# Patient Record
Sex: Female | Born: 1975 | Race: White | Hispanic: No | Marital: Married
Health system: Southern US, Community
[De-identification: ages and names within clinical notes are randomized; demographics above are authoritative.]

## PROBLEM LIST (undated history)

## (undated) HISTORY — PX: AUGMENTATION MAMMAPLASTY: SUR837

---

## 2016-04-16 DIAGNOSIS — J018 Other acute sinusitis: Secondary | ICD-10-CM | POA: Diagnosis not present

## 2016-04-26 DIAGNOSIS — G43A0 Cyclical vomiting, not intractable: Secondary | ICD-10-CM | POA: Diagnosis not present

## 2016-04-26 DIAGNOSIS — R197 Diarrhea, unspecified: Secondary | ICD-10-CM | POA: Diagnosis not present

## 2017-02-18 ENCOUNTER — Other Ambulatory Visit: Payer: Self-pay | Admitting: Family Medicine

## 2017-02-18 DIAGNOSIS — Z1231 Encounter for screening mammogram for malignant neoplasm of breast: Secondary | ICD-10-CM

## 2017-02-18 DIAGNOSIS — Z01419 Encounter for gynecological examination (general) (routine) without abnormal findings: Secondary | ICD-10-CM | POA: Diagnosis not present

## 2017-02-18 DIAGNOSIS — Z6820 Body mass index (BMI) 20.0-20.9, adult: Secondary | ICD-10-CM | POA: Diagnosis not present

## 2017-02-18 DIAGNOSIS — Z23 Encounter for immunization: Secondary | ICD-10-CM | POA: Diagnosis not present

## 2017-02-18 DIAGNOSIS — Z0000 Encounter for general adult medical examination without abnormal findings: Secondary | ICD-10-CM | POA: Diagnosis not present

## 2017-02-18 DIAGNOSIS — Z124 Encounter for screening for malignant neoplasm of cervix: Secondary | ICD-10-CM | POA: Diagnosis not present

## 2017-02-18 DIAGNOSIS — Z136 Encounter for screening for cardiovascular disorders: Secondary | ICD-10-CM | POA: Diagnosis not present

## 2017-03-06 ENCOUNTER — Ambulatory Visit: Payer: Self-pay

## 2017-04-22 DIAGNOSIS — Z23 Encounter for immunization: Secondary | ICD-10-CM | POA: Diagnosis not present

## 2017-12-03 DIAGNOSIS — J020 Streptococcal pharyngitis: Secondary | ICD-10-CM | POA: Diagnosis not present

## 2018-02-05 DIAGNOSIS — L237 Allergic contact dermatitis due to plants, except food: Secondary | ICD-10-CM | POA: Diagnosis not present

## 2018-02-05 DIAGNOSIS — L247 Irritant contact dermatitis due to plants, except food: Secondary | ICD-10-CM | POA: Diagnosis not present

## 2018-02-23 DIAGNOSIS — Z114 Encounter for screening for human immunodeficiency virus [HIV]: Secondary | ICD-10-CM | POA: Diagnosis not present

## 2018-02-23 DIAGNOSIS — Z0000 Encounter for general adult medical examination without abnormal findings: Secondary | ICD-10-CM | POA: Diagnosis not present

## 2018-02-23 DIAGNOSIS — Z13220 Encounter for screening for lipoid disorders: Secondary | ICD-10-CM | POA: Diagnosis not present

## 2018-02-23 DIAGNOSIS — Z1329 Encounter for screening for other suspected endocrine disorder: Secondary | ICD-10-CM | POA: Diagnosis not present

## 2018-02-25 ENCOUNTER — Other Ambulatory Visit: Payer: Self-pay | Admitting: Family Medicine

## 2018-02-25 DIAGNOSIS — Z6821 Body mass index (BMI) 21.0-21.9, adult: Secondary | ICD-10-CM | POA: Diagnosis not present

## 2018-02-25 DIAGNOSIS — Z1231 Encounter for screening mammogram for malignant neoplasm of breast: Secondary | ICD-10-CM

## 2018-02-25 DIAGNOSIS — Z23 Encounter for immunization: Secondary | ICD-10-CM | POA: Diagnosis not present

## 2018-02-25 DIAGNOSIS — Z0000 Encounter for general adult medical examination without abnormal findings: Secondary | ICD-10-CM | POA: Diagnosis not present

## 2018-03-03 ENCOUNTER — Ambulatory Visit: Payer: Self-pay

## 2018-03-20 ENCOUNTER — Ambulatory Visit: Payer: Self-pay

## 2018-04-01 ENCOUNTER — Ambulatory Visit
Admission: RE | Admit: 2018-04-01 | Discharge: 2018-04-01 | Disposition: A | Discharge status: Home / Self Care | Payer: BLUE CROSS/BLUE SHIELD | Source: Ambulatory Visit | Attending: Family Medicine | Admitting: Family Medicine

## 2018-04-01 ENCOUNTER — Other Ambulatory Visit: Payer: Self-pay | Admitting: Family Medicine

## 2018-04-01 DIAGNOSIS — Z1231 Encounter for screening mammogram for malignant neoplasm of breast: Secondary | ICD-10-CM

## 2018-11-29 IMAGING — MG DIGITAL SCREENING BILATERAL MAMMOGRAM WITH IMPLANTS, CAD AND TOM
9 of 12 series · 9 of 28 positions shown · non-contrast
Comparison: None.

CLINICAL DATA: Screening. Baseline.

EXAM:
DIGITAL SCREENING BILATERAL MAMMOGRAM WITH IMPLANTS, CAD AND TOMO
The patient has retropectoral implants. Standard and implant
displaced views were performed.

[R CC]
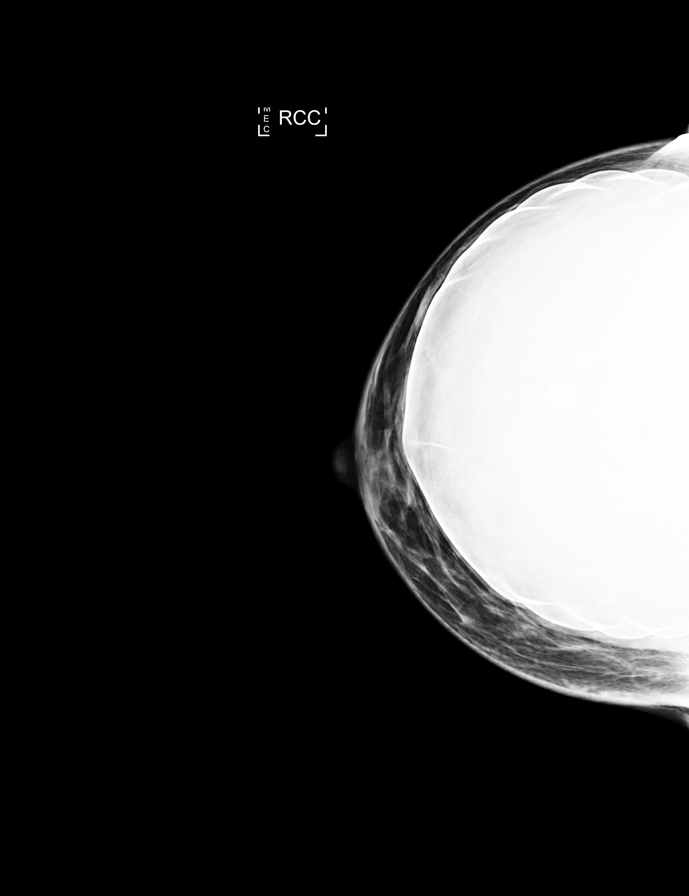

[R MLO]
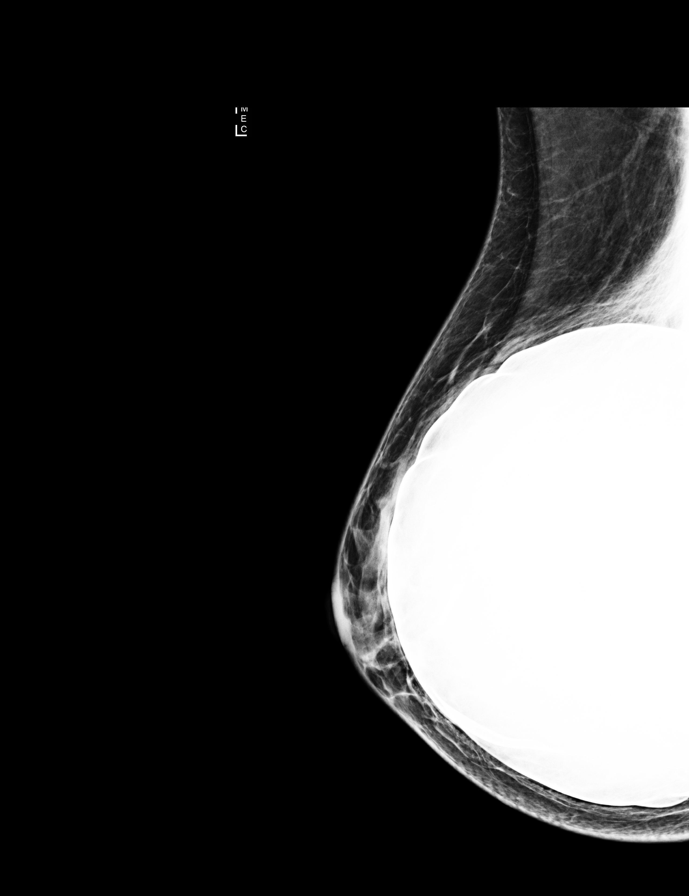

[L CC]
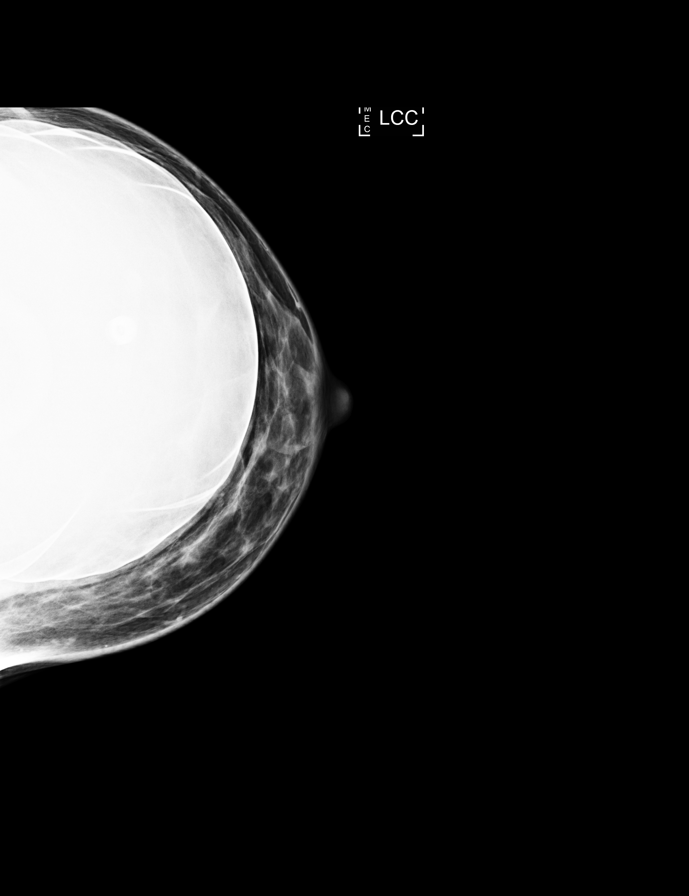

[L MLO]
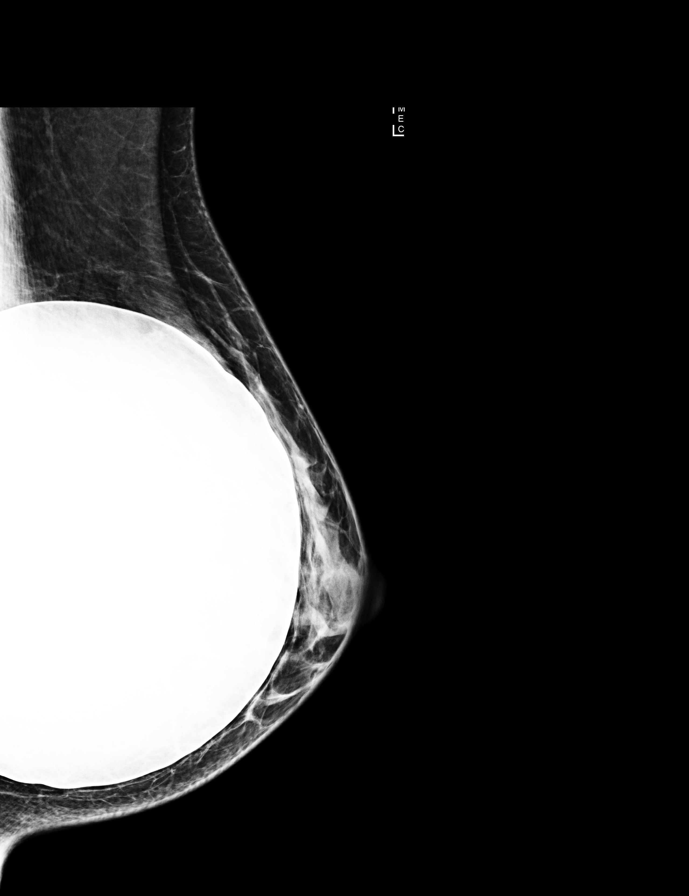

[R MLO synth-2D]
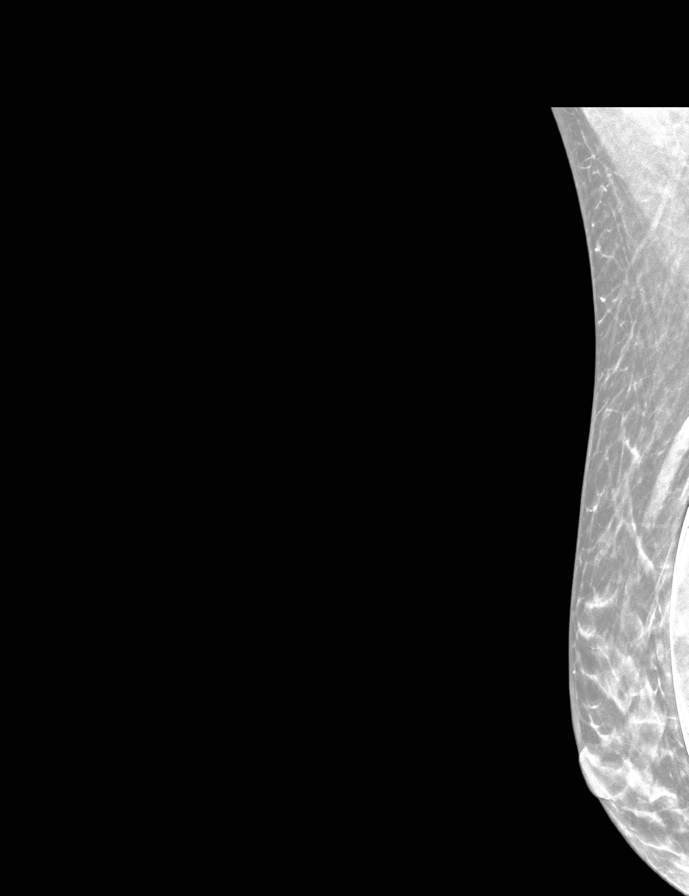

[L CC synth-2D]
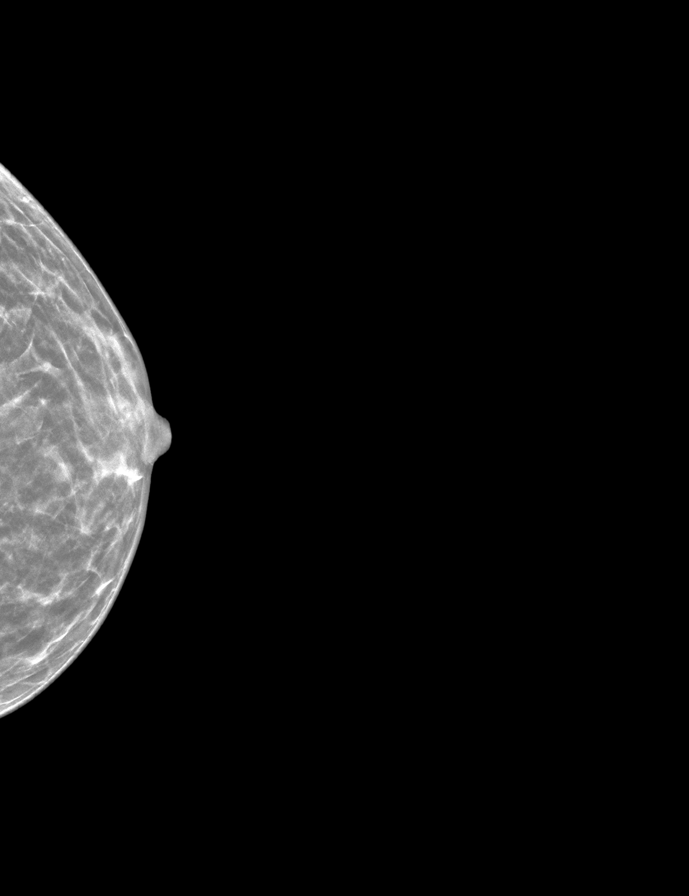

[R CC synth-2D]
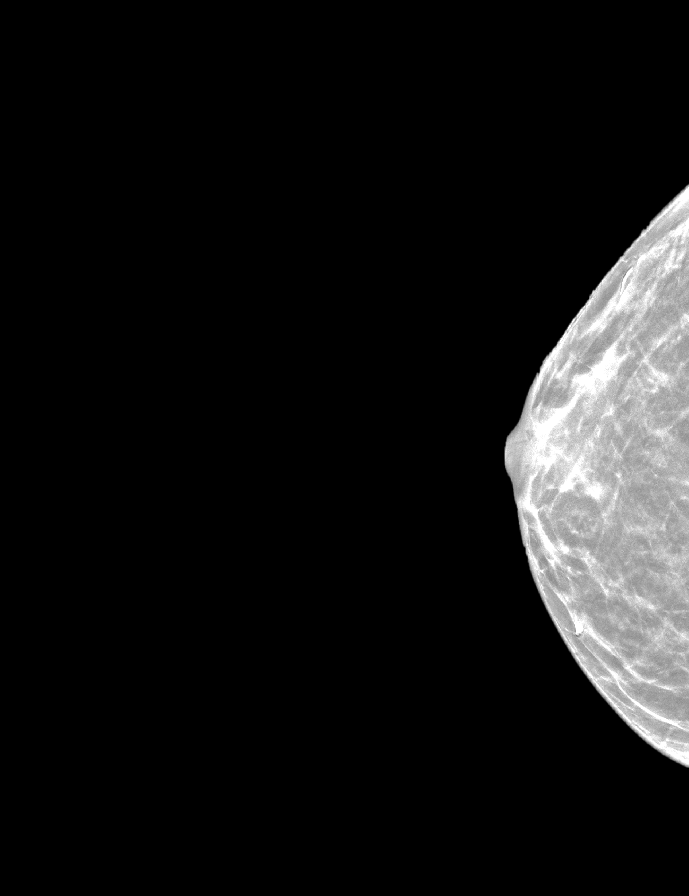

[L MLO synth-2D]
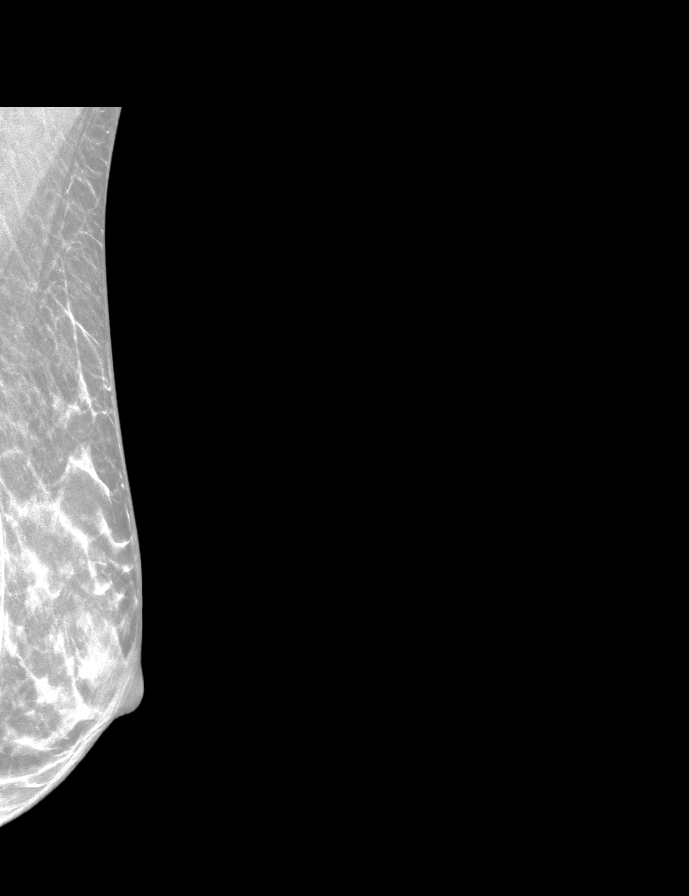

[L CCID BREAST TOMOSYNTHESIS IMAGE tomo · tomo slice 17/32.0]
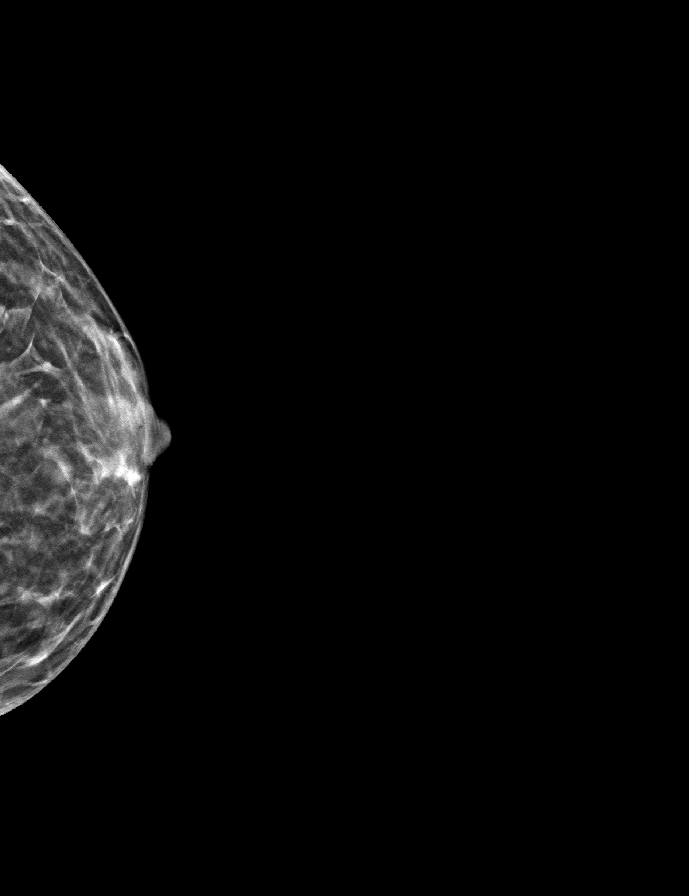

[9 of 28 positions shown; findings below may reference images not displayed]

ACR Breast Density Category b: There are scattered areas of
fibroglandular density.
FINDINGS: There are no findings suspicious for malignancy. Images were
processed with CAD.
IMPRESSION: No mammographic evidence of malignancy. A result letter of this
screening mammogram will be mailed directly to the patient.

RECOMMENDATION:
Screening mammogram in one year. (Code:O8-V-9GB)

BI-RADS CATEGORY  1:  Negative.

## 2019-02-25 DIAGNOSIS — Z114 Encounter for screening for human immunodeficiency virus [HIV]: Secondary | ICD-10-CM | POA: Diagnosis not present

## 2019-02-25 DIAGNOSIS — Z125 Encounter for screening for malignant neoplasm of prostate: Secondary | ICD-10-CM | POA: Diagnosis not present

## 2019-02-25 DIAGNOSIS — Z1329 Encounter for screening for other suspected endocrine disorder: Secondary | ICD-10-CM | POA: Diagnosis not present

## 2019-02-25 DIAGNOSIS — Z13220 Encounter for screening for lipoid disorders: Secondary | ICD-10-CM | POA: Diagnosis not present

## 2019-02-25 DIAGNOSIS — Z0000 Encounter for general adult medical examination without abnormal findings: Secondary | ICD-10-CM | POA: Diagnosis not present

## 2019-03-01 DIAGNOSIS — Z6820 Body mass index (BMI) 20.0-20.9, adult: Secondary | ICD-10-CM | POA: Diagnosis not present

## 2019-03-01 DIAGNOSIS — Z0000 Encounter for general adult medical examination without abnormal findings: Secondary | ICD-10-CM | POA: Diagnosis not present

## 2019-03-18 ENCOUNTER — Other Ambulatory Visit: Payer: Self-pay | Admitting: Family Medicine

## 2019-03-18 DIAGNOSIS — Z1231 Encounter for screening mammogram for malignant neoplasm of breast: Secondary | ICD-10-CM

## 2019-05-26 DIAGNOSIS — E039 Hypothyroidism, unspecified: Secondary | ICD-10-CM | POA: Diagnosis not present

## 2019-05-26 DIAGNOSIS — N924 Excessive bleeding in the premenopausal period: Secondary | ICD-10-CM | POA: Diagnosis not present

## 2019-05-26 DIAGNOSIS — N926 Irregular menstruation, unspecified: Secondary | ICD-10-CM | POA: Diagnosis not present

## 2019-07-02 DIAGNOSIS — N924 Excessive bleeding in the premenopausal period: Secondary | ICD-10-CM | POA: Diagnosis not present

## 2019-07-02 DIAGNOSIS — N926 Irregular menstruation, unspecified: Secondary | ICD-10-CM | POA: Diagnosis not present

## 2019-07-02 DIAGNOSIS — E039 Hypothyroidism, unspecified: Secondary | ICD-10-CM | POA: Diagnosis not present

## 2019-07-09 DIAGNOSIS — N926 Irregular menstruation, unspecified: Secondary | ICD-10-CM | POA: Diagnosis not present

## 2019-07-09 DIAGNOSIS — N924 Excessive bleeding in the premenopausal period: Secondary | ICD-10-CM | POA: Diagnosis not present

## 2019-07-09 DIAGNOSIS — E039 Hypothyroidism, unspecified: Secondary | ICD-10-CM | POA: Diagnosis not present

## 2020-01-04 DIAGNOSIS — L720 Epidermal cyst: Secondary | ICD-10-CM | POA: Diagnosis not present

## 2020-01-04 DIAGNOSIS — L814 Other melanin hyperpigmentation: Secondary | ICD-10-CM | POA: Diagnosis not present

## 2020-01-04 DIAGNOSIS — D225 Melanocytic nevi of trunk: Secondary | ICD-10-CM | POA: Diagnosis not present

## 2020-01-04 DIAGNOSIS — L723 Sebaceous cyst: Secondary | ICD-10-CM | POA: Diagnosis not present

## 2020-01-17 DIAGNOSIS — L720 Epidermal cyst: Secondary | ICD-10-CM | POA: Diagnosis not present

## 2020-02-25 ENCOUNTER — Other Ambulatory Visit: Payer: Self-pay

## 2020-02-25 ENCOUNTER — Ambulatory Visit (INDEPENDENT_AMBULATORY_CARE_PROVIDER_SITE_OTHER): Payer: Self-pay

## 2020-02-25 DIAGNOSIS — I781 Nevus, non-neoplastic: Secondary | ICD-10-CM

## 2020-02-25 NOTE — Progress Notes (Signed)
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 Dr. Randie Heinz came in to assess pt prior to treatment. Pt tolerated well. Anticipate good results. Discussed the possible need for another tx. Post procedure care instructions discussed and given on  Handout. Will follow PRN   Cutaneous Laser:pulsed mode  594j/cm2 400 ms delay  13 ms Duration 0.5 spot  Total pulses: 605 Total energy 705.37J  Total time:6   Photos: Yes.

## 2020-03-16 ENCOUNTER — Telehealth (HOSPITAL_COMMUNITY): Payer: Self-pay

## 2020-03-16 NOTE — Telephone Encounter (Signed)

## 2020-03-17 ENCOUNTER — Other Ambulatory Visit: Payer: Self-pay

## 2020-03-17 ENCOUNTER — Ambulatory Visit: Payer: Self-pay

## 2020-03-17 DIAGNOSIS — I781 Nevus, non-neoplastic: Secondary | ICD-10-CM

## 2020-03-17 NOTE — Progress Notes (Signed)
Retreated pt's R chest area with subcutaneous laser for telangiectasia. Retreated on a slightly higher setting since pt had minimal improvement from last treatment.  Pt tolerated well.  Hoping she will see better results this time. Provided post procedure care instructions both verbally and on handout. Will follow PRN. Cutaneous Laser:pulsed mode  810j/cm2 400 ms delay  13 ms Duration 0.5 spot  Total pulses: 733 Total energy 854.99J  Total time:7 3  Photos: Yes.

## 2020-03-20 ENCOUNTER — Ambulatory Visit: Payer: BLUE CROSS/BLUE SHIELD

## 2020-04-10 DIAGNOSIS — L723 Sebaceous cyst: Secondary | ICD-10-CM | POA: Diagnosis not present

## 2020-05-01 DIAGNOSIS — L720 Epidermal cyst: Secondary | ICD-10-CM | POA: Diagnosis not present

## 2020-09-04 DIAGNOSIS — D2371 Other benign neoplasm of skin of right lower limb, including hip: Secondary | ICD-10-CM | POA: Diagnosis not present

## 2020-09-04 DIAGNOSIS — L565 Disseminated superficial actinic porokeratosis (DSAP): Secondary | ICD-10-CM | POA: Diagnosis not present

## 2020-09-04 DIAGNOSIS — L570 Actinic keratosis: Secondary | ICD-10-CM | POA: Diagnosis not present

## 2020-09-04 DIAGNOSIS — L814 Other melanin hyperpigmentation: Secondary | ICD-10-CM | POA: Diagnosis not present

## 2020-09-04 DIAGNOSIS — D2271 Melanocytic nevi of right lower limb, including hip: Secondary | ICD-10-CM | POA: Diagnosis not present

## 2020-09-04 DIAGNOSIS — D225 Melanocytic nevi of trunk: Secondary | ICD-10-CM | POA: Diagnosis not present

## 2020-09-18 DIAGNOSIS — Z2821 Immunization not carried out because of patient refusal: Secondary | ICD-10-CM | POA: Diagnosis not present

## 2020-09-18 DIAGNOSIS — E039 Hypothyroidism, unspecified: Secondary | ICD-10-CM | POA: Diagnosis not present

## 2020-11-29 ENCOUNTER — Other Ambulatory Visit: Payer: Self-pay | Admitting: Family Medicine

## 2020-11-29 DIAGNOSIS — Z1231 Encounter for screening mammogram for malignant neoplasm of breast: Secondary | ICD-10-CM

## 2020-12-14 DIAGNOSIS — Z1231 Encounter for screening mammogram for malignant neoplasm of breast: Secondary | ICD-10-CM | POA: Diagnosis not present

## 2021-10-26 DIAGNOSIS — R0981 Nasal congestion: Secondary | ICD-10-CM | POA: Diagnosis not present

## 2021-10-26 DIAGNOSIS — J029 Acute pharyngitis, unspecified: Secondary | ICD-10-CM | POA: Diagnosis not present

## 2021-10-26 DIAGNOSIS — G4452 New daily persistent headache (NDPH): Secondary | ICD-10-CM | POA: Diagnosis not present

## 2021-11-08 DIAGNOSIS — E039 Hypothyroidism, unspecified: Secondary | ICD-10-CM | POA: Diagnosis not present

## 2022-04-11 DIAGNOSIS — Z114 Encounter for screening for human immunodeficiency virus [HIV]: Secondary | ICD-10-CM | POA: Diagnosis not present

## 2022-04-11 DIAGNOSIS — Z0000 Encounter for general adult medical examination without abnormal findings: Secondary | ICD-10-CM | POA: Diagnosis not present

## 2022-04-18 DIAGNOSIS — Z0000 Encounter for general adult medical examination without abnormal findings: Secondary | ICD-10-CM | POA: Diagnosis not present

## 2022-04-18 DIAGNOSIS — Z1211 Encounter for screening for malignant neoplasm of colon: Secondary | ICD-10-CM | POA: Diagnosis not present

## 2022-04-18 DIAGNOSIS — Z2821 Immunization not carried out because of patient refusal: Secondary | ICD-10-CM | POA: Diagnosis not present

## 2022-04-18 DIAGNOSIS — Z1159 Encounter for screening for other viral diseases: Secondary | ICD-10-CM | POA: Diagnosis not present

## 2022-04-18 DIAGNOSIS — D649 Anemia, unspecified: Secondary | ICD-10-CM | POA: Diagnosis not present

## 2022-04-18 DIAGNOSIS — N920 Excessive and frequent menstruation with regular cycle: Secondary | ICD-10-CM | POA: Diagnosis not present

## 2022-06-05 DIAGNOSIS — E039 Hypothyroidism, unspecified: Secondary | ICD-10-CM | POA: Diagnosis not present

## 2023-05-28 DIAGNOSIS — S39012A Strain of muscle, fascia and tendon of lower back, initial encounter: Secondary | ICD-10-CM | POA: Diagnosis not present

## 2023-05-28 DIAGNOSIS — S39012S Strain of muscle, fascia and tendon of lower back, sequela: Secondary | ICD-10-CM | POA: Diagnosis not present

## 2023-05-28 DIAGNOSIS — M545 Low back pain, unspecified: Secondary | ICD-10-CM | POA: Diagnosis not present

## 2023-05-29 DIAGNOSIS — S39012A Strain of muscle, fascia and tendon of lower back, initial encounter: Secondary | ICD-10-CM | POA: Diagnosis not present

## 2023-12-15 DIAGNOSIS — Z862 Personal history of diseases of the blood and blood-forming organs and certain disorders involving the immune mechanism: Secondary | ICD-10-CM | POA: Diagnosis not present

## 2023-12-15 DIAGNOSIS — E039 Hypothyroidism, unspecified: Secondary | ICD-10-CM | POA: Diagnosis not present

## 2023-12-23 DIAGNOSIS — J019 Acute sinusitis, unspecified: Secondary | ICD-10-CM | POA: Diagnosis not present

## 2024-01-14 DIAGNOSIS — M545 Low back pain, unspecified: Secondary | ICD-10-CM | POA: Diagnosis not present

## 2024-01-14 DIAGNOSIS — M25561 Pain in right knee: Secondary | ICD-10-CM | POA: Diagnosis not present

## 2024-01-19 DIAGNOSIS — Z0000 Encounter for general adult medical examination without abnormal findings: Secondary | ICD-10-CM | POA: Diagnosis not present

## 2024-01-19 DIAGNOSIS — Z13220 Encounter for screening for lipoid disorders: Secondary | ICD-10-CM | POA: Diagnosis not present

## 2024-01-21 DIAGNOSIS — M545 Low back pain, unspecified: Secondary | ICD-10-CM | POA: Diagnosis not present

## 2024-02-02 DIAGNOSIS — D649 Anemia, unspecified: Secondary | ICD-10-CM | POA: Diagnosis not present

## 2024-02-02 DIAGNOSIS — Z0000 Encounter for general adult medical examination without abnormal findings: Secondary | ICD-10-CM | POA: Diagnosis not present

## 2024-02-09 DIAGNOSIS — M5416 Radiculopathy, lumbar region: Secondary | ICD-10-CM | POA: Diagnosis not present

## 2024-03-02 DIAGNOSIS — M5416 Radiculopathy, lumbar region: Secondary | ICD-10-CM | POA: Diagnosis not present

## 2024-03-08 DIAGNOSIS — M5416 Radiculopathy, lumbar region: Secondary | ICD-10-CM | POA: Diagnosis not present

## 2024-03-09 DIAGNOSIS — M5416 Radiculopathy, lumbar region: Secondary | ICD-10-CM | POA: Diagnosis not present

## 2024-03-10 DIAGNOSIS — M5416 Radiculopathy, lumbar region: Secondary | ICD-10-CM | POA: Diagnosis not present

## 2024-03-16 DIAGNOSIS — M5416 Radiculopathy, lumbar region: Secondary | ICD-10-CM | POA: Diagnosis not present

## 2024-03-18 DIAGNOSIS — M5416 Radiculopathy, lumbar region: Secondary | ICD-10-CM | POA: Diagnosis not present

## 2024-03-23 DIAGNOSIS — M5416 Radiculopathy, lumbar region: Secondary | ICD-10-CM | POA: Diagnosis not present

## 2024-03-25 DIAGNOSIS — M5416 Radiculopathy, lumbar region: Secondary | ICD-10-CM | POA: Diagnosis not present

## 2024-03-30 DIAGNOSIS — M5416 Radiculopathy, lumbar region: Secondary | ICD-10-CM | POA: Diagnosis not present

## 2024-04-01 DIAGNOSIS — M5416 Radiculopathy, lumbar region: Secondary | ICD-10-CM | POA: Diagnosis not present

## 2024-04-13 DIAGNOSIS — M5416 Radiculopathy, lumbar region: Secondary | ICD-10-CM | POA: Diagnosis not present

## 2024-04-15 DIAGNOSIS — M5416 Radiculopathy, lumbar region: Secondary | ICD-10-CM | POA: Diagnosis not present

## 2024-04-20 DIAGNOSIS — M5416 Radiculopathy, lumbar region: Secondary | ICD-10-CM | POA: Diagnosis not present

## 2024-04-22 DIAGNOSIS — M5416 Radiculopathy, lumbar region: Secondary | ICD-10-CM | POA: Diagnosis not present

## 2024-04-29 DIAGNOSIS — M5416 Radiculopathy, lumbar region: Secondary | ICD-10-CM | POA: Diagnosis not present

## 2024-05-04 DIAGNOSIS — M5416 Radiculopathy, lumbar region: Secondary | ICD-10-CM | POA: Diagnosis not present

## 2024-05-06 DIAGNOSIS — M5416 Radiculopathy, lumbar region: Secondary | ICD-10-CM | POA: Diagnosis not present

## 2024-05-11 DIAGNOSIS — M5416 Radiculopathy, lumbar region: Secondary | ICD-10-CM | POA: Diagnosis not present

## 2024-05-13 DIAGNOSIS — M5416 Radiculopathy, lumbar region: Secondary | ICD-10-CM | POA: Diagnosis not present

## 2024-05-25 DIAGNOSIS — M5416 Radiculopathy, lumbar region: Secondary | ICD-10-CM | POA: Diagnosis not present

## 2024-06-22 DIAGNOSIS — M5416 Radiculopathy, lumbar region: Secondary | ICD-10-CM | POA: Diagnosis not present

## 2024-06-29 DIAGNOSIS — M5416 Radiculopathy, lumbar region: Secondary | ICD-10-CM | POA: Diagnosis not present

## 2024-07-13 DIAGNOSIS — M5416 Radiculopathy, lumbar region: Secondary | ICD-10-CM | POA: Diagnosis not present

## 2024-07-20 DIAGNOSIS — M5416 Radiculopathy, lumbar region: Secondary | ICD-10-CM | POA: Diagnosis not present

## 2024-07-27 DIAGNOSIS — M5431 Sciatica, right side: Secondary | ICD-10-CM | POA: Diagnosis not present

## 2024-07-27 DIAGNOSIS — S336XXA Sprain of sacroiliac joint, initial encounter: Secondary | ICD-10-CM | POA: Diagnosis not present

## 2024-07-27 DIAGNOSIS — M5416 Radiculopathy, lumbar region: Secondary | ICD-10-CM | POA: Diagnosis not present

## 2024-07-28 DIAGNOSIS — S336XXA Sprain of sacroiliac joint, initial encounter: Secondary | ICD-10-CM | POA: Diagnosis not present

## 2024-07-28 DIAGNOSIS — M5431 Sciatica, right side: Secondary | ICD-10-CM | POA: Diagnosis not present

## 2024-08-02 DIAGNOSIS — M5431 Sciatica, right side: Secondary | ICD-10-CM | POA: Diagnosis not present

## 2024-08-02 DIAGNOSIS — S336XXA Sprain of sacroiliac joint, initial encounter: Secondary | ICD-10-CM | POA: Diagnosis not present

## 2024-08-04 DIAGNOSIS — S336XXA Sprain of sacroiliac joint, initial encounter: Secondary | ICD-10-CM | POA: Diagnosis not present

## 2024-08-04 DIAGNOSIS — M5431 Sciatica, right side: Secondary | ICD-10-CM | POA: Diagnosis not present

## 2024-08-09 DIAGNOSIS — M5431 Sciatica, right side: Secondary | ICD-10-CM | POA: Diagnosis not present

## 2024-08-09 DIAGNOSIS — S336XXA Sprain of sacroiliac joint, initial encounter: Secondary | ICD-10-CM | POA: Diagnosis not present

## 2024-08-11 DIAGNOSIS — M5431 Sciatica, right side: Secondary | ICD-10-CM | POA: Diagnosis not present

## 2024-08-11 DIAGNOSIS — S336XXA Sprain of sacroiliac joint, initial encounter: Secondary | ICD-10-CM | POA: Diagnosis not present

## 2024-08-16 DIAGNOSIS — S336XXA Sprain of sacroiliac joint, initial encounter: Secondary | ICD-10-CM | POA: Diagnosis not present

## 2024-08-16 DIAGNOSIS — M5431 Sciatica, right side: Secondary | ICD-10-CM | POA: Diagnosis not present

## 2024-09-08 DIAGNOSIS — S336XXA Sprain of sacroiliac joint, initial encounter: Secondary | ICD-10-CM | POA: Diagnosis not present

## 2024-09-08 DIAGNOSIS — M5431 Sciatica, right side: Secondary | ICD-10-CM | POA: Diagnosis not present

## 2024-09-15 DIAGNOSIS — M5431 Sciatica, right side: Secondary | ICD-10-CM | POA: Diagnosis not present

## 2024-09-15 DIAGNOSIS — S336XXA Sprain of sacroiliac joint, initial encounter: Secondary | ICD-10-CM | POA: Diagnosis not present

## 2024-09-22 DIAGNOSIS — S336XXA Sprain of sacroiliac joint, initial encounter: Secondary | ICD-10-CM | POA: Diagnosis not present

## 2024-09-22 DIAGNOSIS — M5431 Sciatica, right side: Secondary | ICD-10-CM | POA: Diagnosis not present

## 2024-09-28 DIAGNOSIS — G629 Polyneuropathy, unspecified: Secondary | ICD-10-CM | POA: Diagnosis not present
